# Patient Record
Sex: Male | Born: 1965
Health system: Southern US, Community
[De-identification: ages and names within clinical notes are randomized; demographics above are authoritative.]

## PROBLEM LIST (undated history)

## (undated) DIAGNOSIS — K219 Gastro-esophageal reflux disease without esophagitis: Secondary | ICD-10-CM

---

## 2015-02-05 ENCOUNTER — Emergency Department (HOSPITAL_COMMUNITY)
Admission: EM | Admit: 2015-02-05 | Discharge: 2015-02-06 | Disposition: A | Discharge status: Home / Self Care | Payer: BLUE CROSS/BLUE SHIELD | Attending: Emergency Medicine | Admitting: Emergency Medicine

## 2015-02-05 ENCOUNTER — Encounter (HOSPITAL_COMMUNITY): Payer: Self-pay | Admitting: Emergency Medicine

## 2015-02-05 DIAGNOSIS — N3090 Cystitis, unspecified without hematuria: Secondary | ICD-10-CM | POA: Diagnosis not present

## 2015-02-05 DIAGNOSIS — Z8719 Personal history of other diseases of the digestive system: Secondary | ICD-10-CM | POA: Diagnosis not present

## 2015-02-05 DIAGNOSIS — R0782 Intercostal pain: Secondary | ICD-10-CM | POA: Diagnosis not present

## 2015-02-05 DIAGNOSIS — R1032 Left lower quadrant pain: Secondary | ICD-10-CM

## 2015-02-05 DIAGNOSIS — R1030 Lower abdominal pain, unspecified: Secondary | ICD-10-CM | POA: Diagnosis present

## 2015-02-05 HISTORY — DX: Gastro-esophageal reflux disease without esophagitis: K21.9

## 2015-02-05 LAB — URINALYSIS, ROUTINE W REFLEX MICROSCOPIC
BILIRUBIN URINE: NEGATIVE
Glucose, UA: NEGATIVE mg/dL
Hgb urine dipstick: NEGATIVE
Ketones, ur: NEGATIVE mg/dL
Leukocytes, UA: NEGATIVE
NITRITE: NEGATIVE
Protein, ur: NEGATIVE mg/dL
SPECIFIC GRAVITY, URINE: 1.004 — AB (ref 1.005–1.030)
UROBILINOGEN UA: 0.2 mg/dL (ref 0.0–1.0)
pH: 6.5 (ref 5.0–8.0)

## 2015-02-05 LAB — LIPASE, BLOOD: Lipase: 43 U/L (ref 22–51)

## 2015-02-05 LAB — COMPREHENSIVE METABOLIC PANEL
ALT: 14 U/L — ABNORMAL LOW (ref 17–63)
ANION GAP: 8 (ref 5–15)
AST: 25 U/L (ref 15–41)
Albumin: 4.4 g/dL (ref 3.5–5.0)
Alkaline Phosphatase: 44 U/L (ref 38–126)
BILIRUBIN TOTAL: 0.8 mg/dL (ref 0.3–1.2)
BUN: 11 mg/dL (ref 6–20)
CO2: 29 mmol/L (ref 22–32)
Calcium: 9.5 mg/dL (ref 8.9–10.3)
Chloride: 102 mmol/L (ref 101–111)
Creatinine, Ser: 1.01 mg/dL (ref 0.61–1.24)
Glucose, Bld: 82 mg/dL (ref 65–99)
POTASSIUM: 4.1 mmol/L (ref 3.5–5.1)
Sodium: 139 mmol/L (ref 135–145)
TOTAL PROTEIN: 7 g/dL (ref 6.5–8.1)

## 2015-02-05 LAB — CBC
HEMATOCRIT: 45 % (ref 39.0–52.0)
HEMOGLOBIN: 15.5 g/dL (ref 13.0–17.0)
MCH: 31.6 pg (ref 26.0–34.0)
MCHC: 34.4 g/dL (ref 30.0–36.0)
MCV: 91.6 fL (ref 78.0–100.0)
Platelets: 200 10*3/uL (ref 150–400)
RBC: 4.91 MIL/uL (ref 4.22–5.81)
RDW: 12.5 % (ref 11.5–15.5)
WBC: 3.5 10*3/uL — AB (ref 4.0–10.5)

## 2015-02-05 MED ORDER — SODIUM CHLORIDE 0.9 % IV BOLUS (SEPSIS)
500.0000 mL | Freq: Once | INTRAVENOUS | Status: AC
  Administered 2015-02-06: 500 mL via INTRAVENOUS

## 2015-02-05 MED ORDER — KETOROLAC TROMETHAMINE 30 MG/ML IJ SOLN
15.0000 mg | Freq: Once | INTRAMUSCULAR | Status: AC
  Administered 2015-02-06: 15 mg via INTRAVENOUS
  Filled 2015-02-05: qty 1

## 2015-02-05 NOTE — ED Provider Notes (Signed)
CSN: [id number]     Arrival date & time 02/05/15  2027 History  This chart was scribed for Peter Munch, MD by Octavia Heir, ED Scribe. This patient was seen in room A07C/A07C and the patient's care was started at 11:39 PM.     Chief Complaint  Patient presents with  . Abdominal Pain      The history is provided by the patient. No language interpreter was used.   HPI Comments: Kinser Fellman is a 49 y.o. male who presents to the Emergency Department complaining of sudden onset, gradual worsening, intermittent, lower abdominal pain onset 2 weeks ago. Pt rates his current pain a 4/10. He states having intermittent mid lower abdominal pain that he describes is sharp. He reports having a burning sensation in his LUQ that has been constant for the past two days but was typically intermittent previously before. He notes having a colonoscopy done recently and the results were normal. Pt denies nausea, vomiting, fever, chest pain, loss of appetite, abdominal surgeries, urinary changes, difficulty breathing, chest pain, rash, skin changes, confusion, recent travel and scrotal pain.   Past Medical History  Diagnosis Date  . Acid reflux    History reviewed. No pertinent past surgical history. No family history on file. Social History  Substance Use Topics  . Smoking status: Never Smoker   . Smokeless tobacco: None  . Alcohol Use: Yes    Review of Systems  Constitutional: Negative for fever.       Per HPI, otherwise negative  HENT:       Per HPI, otherwise negative  Respiratory:       Per HPI, otherwise negative  Cardiovascular:       Per HPI, otherwise negative  Gastrointestinal: Positive for abdominal pain. Negative for nausea and vomiting.  Endocrine:       Negative aside from HPI  Genitourinary:       Neg aside from HPI   Musculoskeletal:       Per HPI, otherwise negative  Skin: Negative.  Negative for rash.  Neurological: Negative for syncope.  Psychiatric/Behavioral: Negative  for confusion.      Allergies  Review of patient's allergies indicates no known allergies.  Home Medications   Prior to Admission medications   Not on File   Triage vitals: BP 121/80 mmHg  Pulse 72  Temp(Src) 98.3 F (36.8 C) (Oral)  Resp 18  Ht  (1.803 m)  Wt 164 lb (74.39 kg)  BMI 22.88 kg/m2  SpO2 100% Physical Exam  Constitutional: He is oriented to person, place, and time. He appears well-developed. No distress.  HENT:  Head: Normocephalic and atraumatic.  Eyes: Conjunctivae and EOM are normal.  Cardiovascular: Normal rate and regular rhythm.   Pulmonary/Chest: Effort normal. No stridor. No respiratory distress.  Abdominal: He exhibits no distension.  Left intercostal pain with no splenomegaly   Musculoskeletal: He exhibits no edema.  Neurological: He is alert and oriented to person, place, and time.  Skin: Skin is warm and dry.  Psychiatric: He has a normal mood and affect.  Nursing note and vitals reviewed.   ED Course  Procedures  DIAGNOSTIC STUDIES: Oxygen Saturation is 100% on RA, normal by my interpretation.  COORDINATION OF CARE:  11:45 PM Discussed treatment plan with pt at bedside and pt agreed to plan.  Labs Review Labs Reviewed  COMPREHENSIVE METABOLIC PANEL - Abnormal; Notable for the following:    ALT 14 (*)    All other components within normal limits  CBC - Abnormal; Notable for the following:    WBC 3.5 (*)    All other components within normal limits  URINALYSIS, ROUTINE W REFLEX MICROSCOPIC (NOT AT [facility]) - Abnormal; Notable for the following:    Specific Gravity, Urine 1.004 (*)    All other components within normal limits  LIPASE, BLOOD    Imaging Review Ct Abdomen Pelvis W Contrast  02/06/2015   CLINICAL DATA:  Mid lower abdominal pain for 2 weeks. Left lower quadrant burning for 2 days. Left lower quadrant and left inguinal pain is not constant.  EXAM: CT ABDOMEN AND PELVIS WITH CONTRAST  TECHNIQUE: Multidetector CT imaging  of the abdomen and pelvis was performed using the standard protocol following bolus administration of intravenous contrast.  CONTRAST:  OMNIPAQUE IOHEXOL 300 MG/ML  SOLN  COMPARISON:  None.  FINDINGS: Lung bases are clear.  Few scattered sub cm low-attenuation lesions in the liver. These are too small to characterize but likely represent small cysts or hemangiomas. No other focal liver lesions. Gallbladder, spleen, pancreas, adrenal glands, kidneys, abdominal aorta, inferior vena cava, and retroperitoneal lymph nodes are unremarkable. Stomach and small bowel are decompressed. Stool-filled colon without abnormal distention. No free air or free fluid in the abdomen.  Pelvis: Appendix is normal. No free or loculated pelvic fluid collections. No evidence of diverticulitis. Small amount of free fluid in the pelvis is nonspecific but could be reactive. The bladder wall is diffusely thickened which may be due to cystitis or hypertrophy due to outlet obstruction. Prostate gland is enlarged, measuring 4.6 cm transverse diameter. No destructive bone lesions.  IMPRESSION: Prostate gland is enlarged. Bladder wall is thickened. This could be due to outlet obstruction with hypertrophy versus cystitis. Small amount of free fluid in the pelvis is nonspecific but may be reactive. Appendix is normal. No evidence of diverticulitis. Sub cm lesions in the liver, likely cysts.   Electronically Signed   By: Burman Nieves M.D.   On: 02/06/2015 01:24   I have personally reviewed and evaluated these images and lab results as part of my medical decision-making. Exam the patient is in no distress. We discussed all findings at length. Patient will follow-up with urology.   MDM   I personally performed the services described in this documentation, which was scribed in my presence. The recorded information has been reviewed and is accurate.   Well-appearing male presents with new left lower abdominal, left lateral abdominal  pain, nonspecific urinary changes. Here the patient is awake and alert, non-peritoneal abdomen, no fever, low suspicion for occult sepsis, bacteremia. No evidence for UTI. However, the patient's CT scan suggests cystitis, prostatic enlargement. With stable vital signs, the patient was started on an inflammatory medication will follow-up with urology.   Peter Munch, MD 02/06/15 [phone]

## 2015-02-05 NOTE — ED Notes (Signed)
Mid lower abd pain x 2 weeks.  LLQ burning pain x 2 days.  Denies nausea, vomiting, urinary complaints, diarrhea, and constipation.  Last BM 1 hour ago.

## 2015-02-05 NOTE — ED Notes (Signed)
Patient denies difficulty with urination, denies trouble with bowels.

## 2015-02-06 ENCOUNTER — Emergency Department (HOSPITAL_COMMUNITY): Payer: BLUE CROSS/BLUE SHIELD

## 2015-02-06 ENCOUNTER — Encounter (HOSPITAL_COMMUNITY): Payer: Self-pay | Admitting: Radiology

## 2015-02-06 MED ORDER — IOHEXOL 300 MG/ML  SOLN: 25.0000 mL | Freq: Once | INTRAMUSCULAR | Status: DC | PRN

## 2015-02-06 MED ORDER — IOHEXOL 300 MG/ML  SOLN
100.0000 mL | Freq: Once | INTRAMUSCULAR | Status: AC | PRN
  Administered 2015-02-06: 100 mL via INTRAVENOUS

## 2015-02-06 MED ORDER — MELOXICAM 7.5 MG PO TABS: 7.5000 mg | ORAL_TABLET | Freq: Every day | ORAL | Status: DC

## 2015-02-06 NOTE — Discharge Instructions (Signed)
As discussed, today's evaluation is resulted in a diagnosis of cystitis, or bladder wall inflammation.  It is important that you follow-up with our urologists for further evaluation and management.  Return here for concerning changes in your condition.

## 2015-03-29 ENCOUNTER — Emergency Department (HOSPITAL_COMMUNITY): Payer: BLUE CROSS/BLUE SHIELD

## 2015-03-29 ENCOUNTER — Emergency Department (HOSPITAL_COMMUNITY)
Admission: EM | Admit: 2015-03-29 | Discharge: 2015-03-30 | Disposition: A | Discharge status: Home / Self Care | Payer: BLUE CROSS/BLUE SHIELD | Attending: Emergency Medicine | Admitting: Emergency Medicine

## 2015-03-29 ENCOUNTER — Encounter (HOSPITAL_COMMUNITY): Payer: Self-pay | Admitting: *Deleted

## 2015-03-29 DIAGNOSIS — Z8719 Personal history of other diseases of the digestive system: Secondary | ICD-10-CM | POA: Diagnosis not present

## 2015-03-29 DIAGNOSIS — R51 Headache: Secondary | ICD-10-CM | POA: Insufficient documentation

## 2015-03-29 DIAGNOSIS — G44209 Tension-type headache, unspecified, not intractable: Secondary | ICD-10-CM

## 2015-03-29 DIAGNOSIS — R509 Fever, unspecified: Secondary | ICD-10-CM | POA: Insufficient documentation

## 2015-03-29 MED ORDER — DIPHENHYDRAMINE HCL 50 MG/ML IJ SOLN
25.0000 mg | Freq: Once | INTRAMUSCULAR | Status: AC
  Administered 2015-03-29: 25 mg via INTRAVENOUS
  Filled 2015-03-29: qty 1

## 2015-03-29 MED ORDER — SODIUM CHLORIDE 0.9 % IV SOLN
1000.0000 mL | Freq: Once | INTRAVENOUS | Status: AC
  Administered 2015-03-29: 1000 mL via INTRAVENOUS

## 2015-03-29 MED ORDER — METOCLOPRAMIDE HCL 5 MG/ML IJ SOLN
10.0000 mg | Freq: Once | INTRAMUSCULAR | Status: AC
  Administered 2015-03-29: 10 mg via INTRAVENOUS
  Filled 2015-03-29: qty 2

## 2015-03-29 MED ORDER — SODIUM CHLORIDE 0.9 % IV SOLN
1000.0000 mL | INTRAVENOUS | Status: DC
Start: 2015-03-29 — End: 2015-03-30
  Administered 2015-03-29: 1000 mL via INTRAVENOUS

## 2015-03-29 NOTE — ED Provider Notes (Addendum)
CSN: [id number]   Arrival date & time 03/29/15 8657  History  By signing my name below, I, Bethel Born, attest that this documentation has been prepared under the direction and in the presence of Dione Booze, MD. Electronically Signed: Bethel Born, ED Scribe. 03/29/2015. 11:32 PM.  Chief Complaint  Patient presents with  . Headache    HPI The history is provided by the patient. No language interpreter was used.   Peter Chapman is a 49 y.o. male who presents to the Emergency Department complaining of a new and intermittent bilateral headache with onset 2.5 weeks ago. He describes the pain as sharp/pressure and rates it 5/10 in severity with no modifying factors.The pain is worse in the evening. Ibuprofen provided insufficient relief at home. Associated symptoms include tactile fever/temperature of 98 degrees (pt states that his temperature usually runs around 97 degrees) at home for 2 days. Pt denies vision change, weakness, numbness, tingling, nausea, vomiting, gait difficulty, change in strength or coordination, and speech difficulty. His primary care is [facility] where he was seen for his symptoms and referred to the ED.   Past Medical History  Diagnosis Date  . Acid reflux     History reviewed. No pertinent past surgical history.  History reviewed. No pertinent family history.  Social History  Substance Use Topics  . Smoking status: Never Smoker   . Smokeless tobacco: None  . Alcohol Use: Yes     Review of Systems  Constitutional: Positive for fever (Tactile).  Neurological: Positive for headaches.  All other systems reviewed and are negative.  Home Medications   Prior to Admission medications   Medication Sig Start Date End Date Taking? Authorizing Provider  Multiple Vitamin (MULTIVITAMIN WITH MINERALS) TABS tablet Take 1 tablet by mouth daily.   Yes Historical Provider, MD    Allergies  Review of patient's allergies indicates no known  allergies.  Triage Vitals: BP 129/71 mmHg  Pulse 77  Temp(Src) 98.3 F (36.8 C) (Oral)  Resp 22  SpO2 100%  Physical Exam  Constitutional: He is oriented to person, place, and time. He appears well-developed and well-nourished.  HENT:  Head: Normocephalic and atraumatic.  Mild tenderness over right temporalis muscle and insertion of left paracervical muscles  Eyes: EOM are normal. Pupils are equal, round, and reactive to light.  Fundi are normal  Neck: Normal range of motion. No JVD present.  Cardiovascular: Normal rate, regular rhythm, normal heart sounds and intact distal pulses.   No murmur heard. Pulmonary/Chest: Effort normal and breath sounds normal. He has no wheezes. He has no rales. He exhibits no tenderness.  Abdominal: Soft. Bowel sounds are normal. He exhibits no distension and no mass. There is no tenderness.  Musculoskeletal: Normal range of motion.  Lymphadenopathy:    He has no cervical adenopathy.  Neurological: He is alert and oriented to person, place, and time. He has normal reflexes. No cranial nerve deficit. He exhibits normal muscle tone. Coordination normal.  Muscle strength 5/5 in both arms and both legs. Normal coordination. No sensory deficits.  Skin: Skin is warm and dry. No rash noted.  Psychiatric: He has a normal mood and affect. His behavior is normal. Judgment and thought content normal.  Nursing note and vitals reviewed.   ED Course  Procedures   DIAGNOSTIC STUDIES: Oxygen Saturation is 100% on RA, normal by my interpretation.    COORDINATION OF CARE: 11:25 PM Discussed treatment plan which includes lab work, CT head, and pain management with pt  at bedside and pt agreed to plan.  Results for orders placed or performed during the hospital encounter of 03/29/15  Basic metabolic panel  Result Value Ref Range   Sodium 140 135 - 145 mmol/L   Potassium 3.9 3.5 - 5.1 mmol/L   Chloride 107 101 - 111 mmol/L   CO2 24 22 - 32 mmol/L   Glucose,  Bld 100 (H) 65 - 99 mg/dL   BUN 13 6 - 20 mg/dL   Creatinine, Ser 0.98 0.61 - 1.24 mg/dL   Calcium 9.0 8.9 - 11.9 mg/dL   GFR calc non Af Amer >60 >60 mL/min   GFR calc Af Amer >60 >60 mL/min   Anion gap 9 5 - 15  CBC with Differential  Result Value Ref Range   WBC 5.0 4.0 - 10.5 K/uL   RBC 4.44 4.22 - 5.81 MIL/uL   Hemoglobin 14.1 13.0 - 17.0 g/dL   HCT 14.7 82.9 - 56.2 %   MCV 91.0 78.0 - 100.0 fL   MCH 31.8 26.0 - 34.0 pg   MCHC 34.9 30.0 - 36.0 g/dL   RDW 13.0 86.5 - 78.4 %   Platelets 178 150 - 400 K/uL   Neutrophils Relative % 78 %   Neutro Abs 3.8 1.7 - 7.7 K/uL   Lymphocytes Relative 13 %   Lymphs Abs 0.7 0.7 - 4.0 K/uL   Monocytes Relative 8 %   Monocytes Absolute 0.4 0.1 - 1.0 K/uL   Eosinophils Relative 1 %   Eosinophils Absolute 0.0 0.0 - 0.7 K/uL   Basophils Relative 0 %   Basophils Absolute 0.0 0.0 - 0.1 K/uL   Imaging Review Ct Head Wo Contrast  03/30/2015  CLINICAL DATA:  Initial evaluation for headache for 2-1/2 weeks. EXAM: CT HEAD WITHOUT CONTRAST TECHNIQUE: Contiguous axial images were obtained from the base of the skull through the vertex without intravenous contrast. COMPARISON:  None. FINDINGS: There is no acute intracranial hemorrhage or infarct. No mass lesion or midline shift. Gray-white matter differentiation is well maintained. Ventricles are normal in size without evidence of hydrocephalus. CSF containing spaces are within normal limits. No extra-axial fluid collection. The calvarium is intact. Orbital soft tissues are within normal limits. The paranasal sinuses and mastoid air cells are well pneumatized and free of fluid. Scalp soft tissues are unremarkable. IMPRESSION: Normal head CT with no acute intracranial process identified. Electronically Signed   By: Rise Mu M.D.   On: 03/30/2015 00:06    I personally reviewed and evaluated these images and lab results as a part of my medical decision-making.    MDM   Final diagnoses:  Muscle  contraction headache    Headache in patient without significant history of headaches. Pattern of headaches is actually suggestive of muscle contraction headache with symptoms thinning to be very mild early in the day and getting worse as the day goes on and no interference with sleep. Since he has not been prone to headaches, decision was made to get CT scan to screen for serious pathology. He was also given a headache cocktail of normal saline, metoclopramide, and diphenhydramine. CT has come back normal and he had excellent relief of headache from the headache cocktail. He was given reassurance of benign nature of his headache and is discharged with prescription for metoclopramide.  I personally performed the services described in this documentation, which was scribed in my presence. The recorded information has been reviewed and is accurate.      Dione Booze, MD  03/30/15 [id number]  Dione Boozeavid Denham Mose, MD 03/30/15 [phone]

## 2015-03-29 NOTE — ED Notes (Signed)
Pt in c/o headaches for the last few weeks, states the severity comes and goes but is always constant, states this is abnormal for him, reports fever the last few days, fever of 98 at home which patient is calling a fever, also states his head is hot to touch, no distress noted

## 2015-03-30 LAB — CBC WITH DIFFERENTIAL/PLATELET
BASOS ABS: 0 10*3/uL (ref 0.0–0.1)
Basophils Relative: 0 %
EOS ABS: 0 10*3/uL (ref 0.0–0.7)
EOS PCT: 1 %
HCT: 40.4 % (ref 39.0–52.0)
Hemoglobin: 14.1 g/dL (ref 13.0–17.0)
LYMPHS PCT: 13 %
Lymphs Abs: 0.7 10*3/uL (ref 0.7–4.0)
MCH: 31.8 pg (ref 26.0–34.0)
MCHC: 34.9 g/dL (ref 30.0–36.0)
MCV: 91 fL (ref 78.0–100.0)
MONO ABS: 0.4 10*3/uL (ref 0.1–1.0)
Monocytes Relative: 8 %
Neutro Abs: 3.8 10*3/uL (ref 1.7–7.7)
Neutrophils Relative %: 78 %
PLATELETS: 178 10*3/uL (ref 150–400)
RBC: 4.44 MIL/uL (ref 4.22–5.81)
RDW: 12.5 % (ref 11.5–15.5)
WBC: 5 10*3/uL (ref 4.0–10.5)

## 2015-03-30 LAB — BASIC METABOLIC PANEL
Anion gap: 9 (ref 5–15)
BUN: 13 mg/dL (ref 6–20)
CALCIUM: 9 mg/dL (ref 8.9–10.3)
CHLORIDE: 107 mmol/L (ref 101–111)
CO2: 24 mmol/L (ref 22–32)
CREATININE: 0.84 mg/dL (ref 0.61–1.24)
Glucose, Bld: 100 mg/dL — ABNORMAL HIGH (ref 65–99)
POTASSIUM: 3.9 mmol/L (ref 3.5–5.1)
SODIUM: 140 mmol/L (ref 135–145)

## 2015-03-30 MED ORDER — METOCLOPRAMIDE HCL 10 MG PO TABS: 10.0000 mg | ORAL_TABLET | Freq: Four times a day (QID) | ORAL | Status: DC | PRN

## 2015-03-30 NOTE — ED Notes (Signed)
Pt left with all belongings and ambulated out of treatment area.  

## 2015-03-30 NOTE — Discharge Instructions (Signed)
Tension Headache °A tension headache is a feeling of pain, pressure, or aching that is often felt over the front and sides of the head. The pain can be dull, or it can feel tight (constricting). Tension headaches are not normally associated with nausea or vomiting, and they do not get worse with physical activity. Tension headaches can last from 30 minutes to several days. This is the most common type of headache. °CAUSES °The exact cause of this condition is not known. Tension headaches often begin after stress, anxiety, or depression. Other triggers may include: °· Alcohol. °· Too much caffeine, or caffeine withdrawal. °· Respiratory infections, such as colds, flu, or sinus infections. °· Dental problems or teeth clenching. °· Fatigue. °· Holding your head and neck in the same position for a long period of time, such as while using a computer. °· Smoking. °SYMPTOMS °Symptoms of this condition include: °· A feeling of pressure around the head. °· Dull, aching head pain. °· Pain felt over the front and sides of the head. °· Tenderness in the muscles of the head, neck, and shoulders. °DIAGNOSIS °This condition may be diagnosed based on your symptoms and a physical exam. Tests may be done, such as a CT scan or an MRI of your head. These tests may be done if your symptoms are severe or unusual. °TREATMENT °This condition may be treated with lifestyle changes and medicines to help relieve symptoms. °HOME CARE INSTRUCTIONS °Managing Pain °· Take over-the-counter and prescription medicines only as told by your health care provider. °· Lie down in a dark, quiet room when you have a headache. °· If directed, apply ice to the head and neck area: °¨ Put ice in a plastic bag. °¨ Place a towel between your skin and the bag. °¨ Leave the ice on for 20 minutes, 2-3 times per day. °· Use a heating pad or a hot shower to apply heat to the head and neck area as told by your health care provider. °Eating and Drinking °· Eat meals on  a regular schedule. °· Limit alcohol use. °· Decrease your caffeine intake, or stop using caffeine. °General Instructions °· Keep all follow-up visits as told by your health care provider. This is important. °· Keep a headache journal to help find out what may trigger your headaches. For example, write down: °¨ What you eat and drink. °¨ How much sleep you get. °¨ Any change to your diet or medicines. °· Try massage or other relaxation techniques. °· Limit stress. °· Sit up straight, and avoid tensing your muscles. °· Do not use tobacco products, including cigarettes, chewing tobacco, or e-cigarettes. If you need help quitting, ask your health care provider. °· Exercise regularly as told by your health care provider. °· Get 7-9 hours of sleep, or the amount recommended by your health care provider. °SEEK MEDICAL CARE IF: °· Your symptoms are not helped by medicine. °· You have a headache that is different from what you normally experience. °· You have nausea or you vomit. °· You have a fever. °SEEK IMMEDIATE MEDICAL CARE IF: °· Your headache becomes severe. °· You have repeated vomiting. °· You have a stiff neck. °· You have a loss of vision. °· You have problems with speech. °· You have pain in your eye or ear. °· You have muscular weakness or loss of muscle control. °· You lose your balance or you have trouble walking. °· You feel faint or you pass out. °· You have confusion. °  °  This information is not intended to replace advice given to you by your health care provider. Make sure you discuss any questions you have with your health care provider. °  °Document Released: 05/01/2005 Document Revised: 01/20/2015 Document Reviewed: 08/24/2014 °Elsevier Interactive Patient Education ©2016 [employer] ° °Metoclopramide tablets °What is this medicine? °METOCLOPRAMIDE (met oh kloe PRA mide) is used to treat the symptoms of gastroesophageal reflux disease (GERD) like heartburn. It is also used to treat people with slow  emptying of the stomach and intestinal tract. °This medicine may be used for other purposes; ask your health care provider or pharmacist if you have questions. °What should I tell my health care provider before I take this medicine? °They need to know if you have any of these conditions: °-breast cancer °-depression °-diabetes °-heart failure °-high blood pressure °-kidney disease °-liver disease °-Parkinson's disease or a movement disorder °-pheochromocytoma °-seizures °-stomach obstruction, bleeding, or perforation °-an unusual or allergic reaction to metoclopramide, procainamide, sulfites, other medicines, foods, dyes, or preservatives °-pregnant or trying to get pregnant °-breast-feeding °How should I use this medicine? °Take this medicine by mouth with a glass of water. Follow the directions on the prescription label. Take this medicine on an empty stomach, about 30 minutes before eating. Take your doses at regular intervals. Do not take your medicine more often than directed. Do not stop taking except on the advice of your doctor or health care professional. °A special MedGuide will be given to you by the pharmacist with each prescription and refill. Be sure to read this information carefully each time. °Talk to your pediatrician regarding the use of this medicine in children. Special care may be needed. °Overdosage: If you think you have taken too much of this medicine contact a poison control center or emergency room at once. °NOTE: This medicine is only for you. Do not share this medicine with others. °What if I miss a dose? °If you miss a dose, take it as soon as you can. If it is almost time for your next dose, take only that dose. Do not take double or extra doses. °What may interact with this medicine? °-acetaminophen °-cyclosporine °-digoxin °-medicines for blood pressure °-medicines for diabetes, including insulin °-medicines for hay fever and other allergies °-medicines for depression, especially an  Monoamine Oxidase Inhibitor (MAOI) °-medicines for Parkinson's disease, like levodopa °-medicines for sleep or for pain °-tetracycline °This list may not describe all possible interactions. Give your health care provider a list of all the medicines, herbs, non-prescription drugs, or dietary supplements you use. Also tell them if you smoke, drink alcohol, or use illegal drugs. Some items may interact with your medicine. °What should I watch for while using this medicine? °It may take a few weeks for your stomach condition to start to get better. However, do not take this medicine for longer than 12 weeks. The longer you take this medicine, and the more you take it, the greater your chances are of developing serious side effects. If you are an elderly patient, a male patient, or you have diabetes, you may be at an increased risk for side effects from this medicine. Contact your doctor immediately if you start having movements you cannot control such as lip smacking, rapid movements of the tongue, involuntary or uncontrollable movements of the eyes, head, arms and legs, or muscle twitches and spasms. °Patients and their families should watch out for worsening depression or thoughts of suicide. Also watch out for any sudden or severe changes in   feelings such as feeling anxious, agitated, panicky, irritable, hostile, aggressive, impulsive, severely restless, overly excited and hyperactive, or not being able to sleep. If this happens, especially at the beginning of treatment or after a change in dose, call your doctor. Do not treat yourself for high fever. Ask your doctor or health care professional for advice. You may get drowsy or dizzy. Do not drive, use machinery, or do anything that needs mental alertness until you know how this drug affects you. Do not stand or sit up quickly, especially if you are an older patient. This reduces the risk of dizzy or fainting spells. Alcohol can make you more drowsy and dizzy.  Avoid alcoholic drinks. What side effects may I notice from receiving this medicine? Side effects that you should report to your doctor or health care professional as soon as possible: -allergic reactions like skin rash, itching or hives, swelling of the face, lips, or tongue -abnormal production of milk in females -breast enlargement in both males and females -change in the way you walk -difficulty moving, speaking or swallowing -drooling, lip smacking, or rapid movements of the tongue -excessive sweating -fever -involuntary or uncontrollable movements of the eyes, head, arms and legs -irregular heartbeat or palpitations -muscle twitches and spasms -unusually weak or tired Side effects that usually do not require medical attention (report to your doctor or health care professional if they continue or are bothersome): -change in sex drive or performance -depressed mood -diarrhea -difficulty sleeping -headache -menstrual changes -restless or nervous This list may not describe all possible side effects. Call your doctor for medical advice about side effects. You may report side effects to FDA at 1-800-FDA-1088. Where should I keep my medicine? Keep out of the reach of children. Store at room temperature between 20 and 25 degrees C (68 and 77 degrees F). Protect from light. Keep container tightly closed. Throw away any unused medicine after the expiration date. NOTE: This sheet is a summary. It may not cover all possible information. If you have questions about this medicine, talk to your doctor, pharmacist, or health care provider.    2016, Elsevier/Gold Standard. (2011-08-29 13:04:38)

## 2016-08-27 IMAGING — CT CT ABD-PELV W/ CM
2 of 5 series · 16 of 46 positions shown, 18 images · IV contrast (APPLIED)
Comparison: None.

CLINICAL DATA: Mid lower abdominal pain for 2 weeks. Left lower
quadrant burning for 2 days. Left lower quadrant and left inguinal
pain is not constant.

EXAM:
CT ABDOMEN AND PELVIS WITH CONTRAST
TECHNIQUE: Multidetector CT imaging of the abdomen and pelvis was performed
using the standard protocol following bolus administration of
intravenous contrast.
CONTRAST:  100mL OMNIPAQUE IOHEXOL 300 MG/ML  SOLN

[Series 2: abd/ pelvis 5.0 i30f 1 · axial · 0.78mm/px · z∈[-678,-298]mm · 13 of 88 slices shown, 15 images]
[im 6/88  soft-tissue]
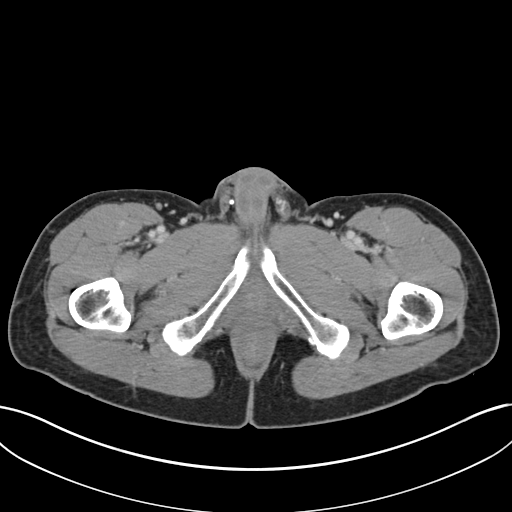
[im 6/88  bone]
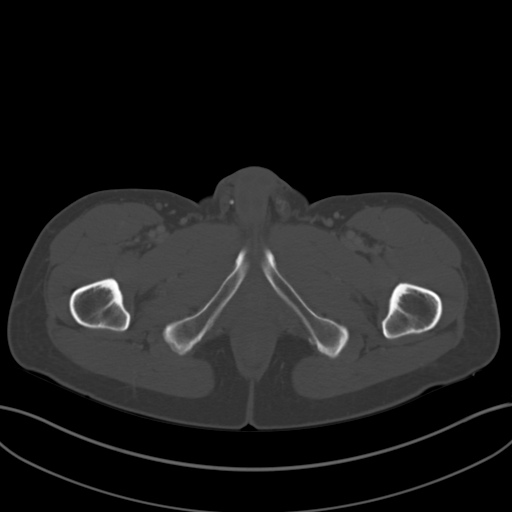
[im 11/88  soft-tissue]
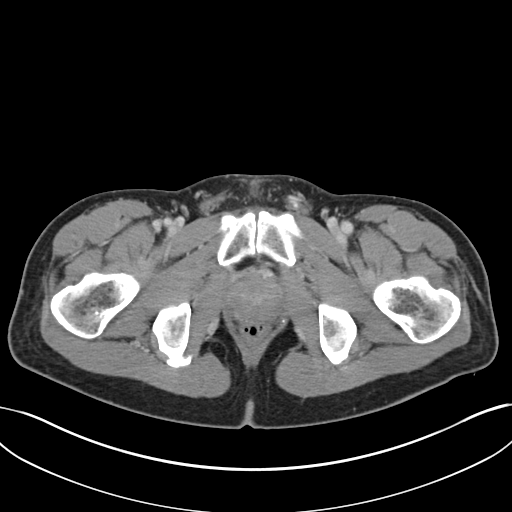
[im 21/88  soft-tissue]
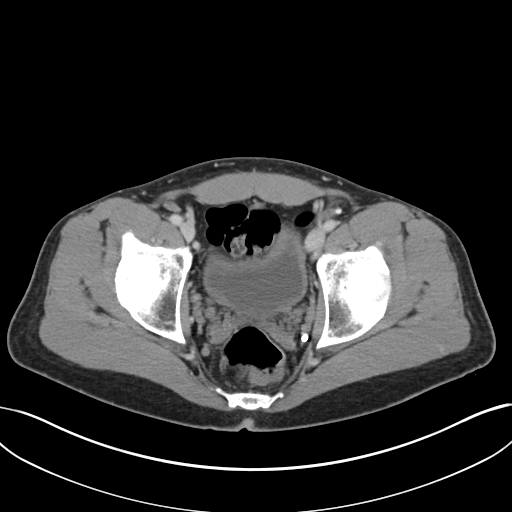
[im 26/88  soft-tissue]
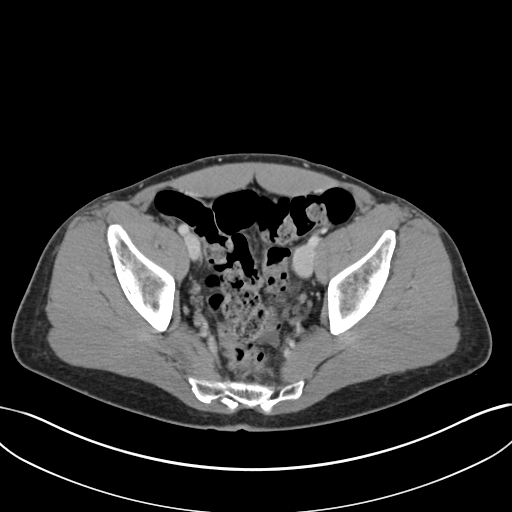
[im 31/88  soft-tissue]
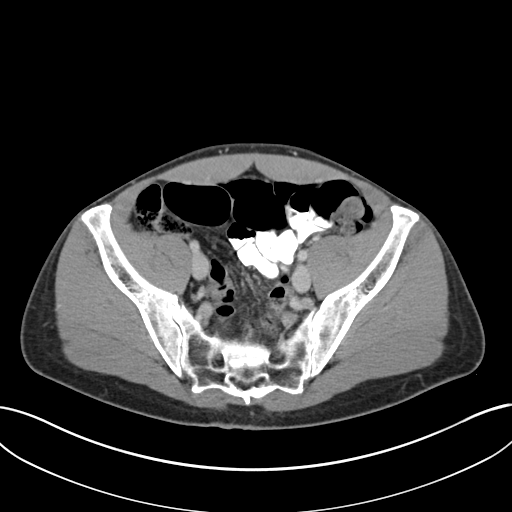
[im 36/88  soft-tissue]
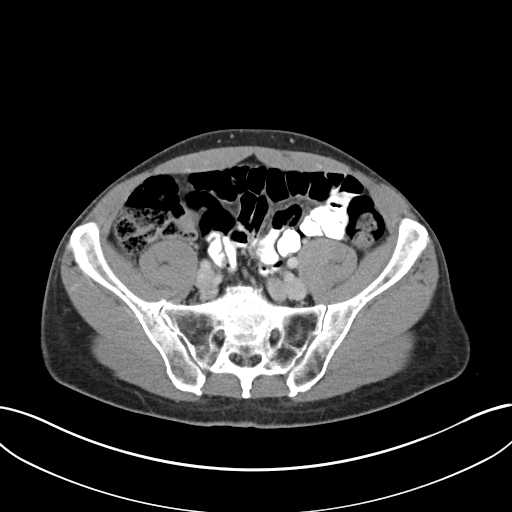
[im 47/88  soft-tissue]
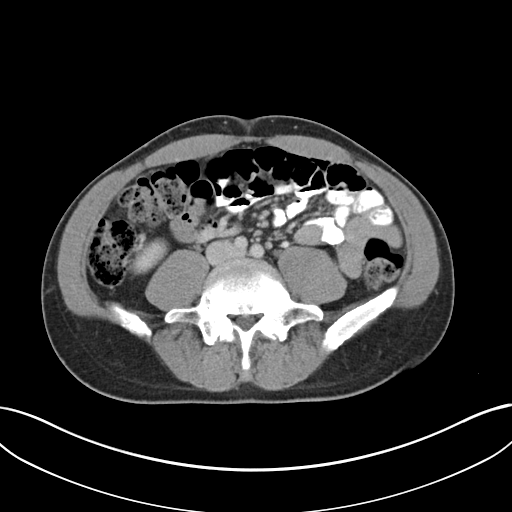
[im 52/88  soft-tissue]
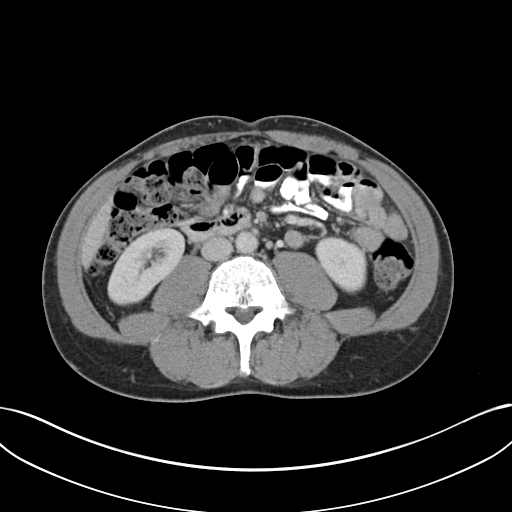
[im 57/88  soft-tissue]
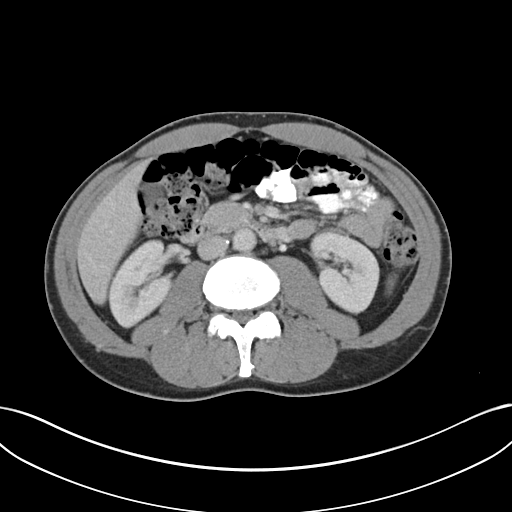
[im 57/88  bone]
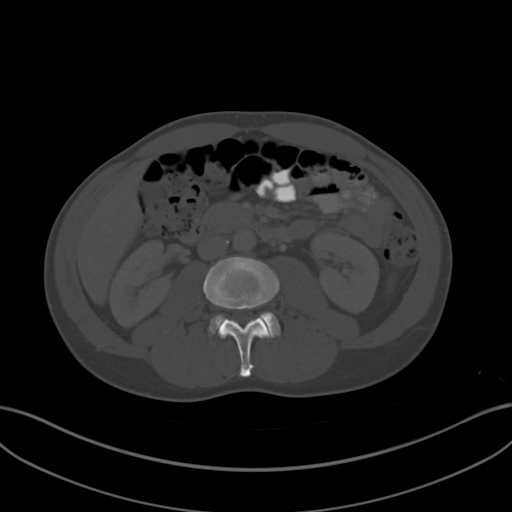
[im 62/88  soft-tissue]
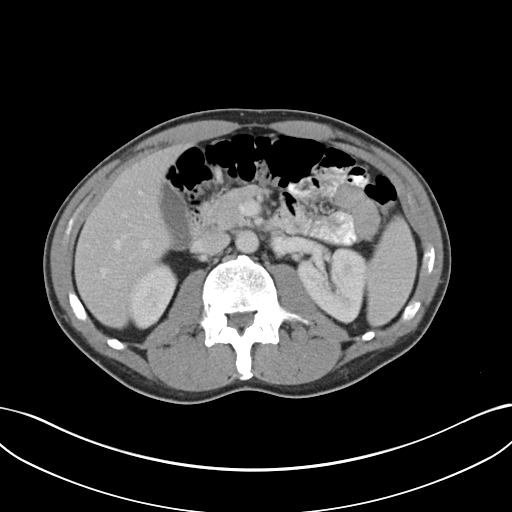
[im 67/88  soft-tissue]
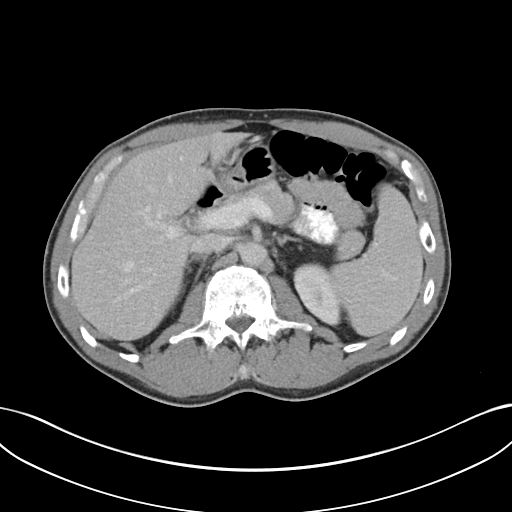
[im 77/88  soft-tissue]
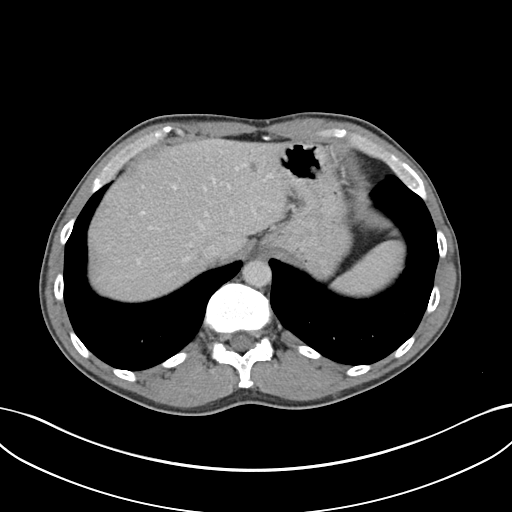
[im 82/88  soft-tissue]
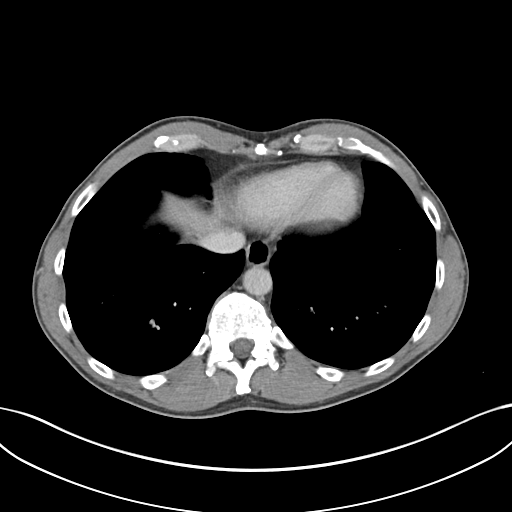

[Series 5: coronal soft tissue · coronal · 0.72mm/px · 3 of 89 slices shown]
[im 30/89  soft-tissue]
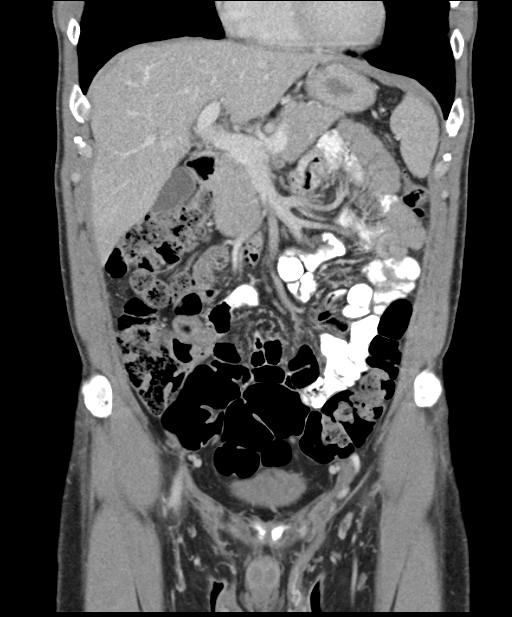
[im 40/89  soft-tissue]
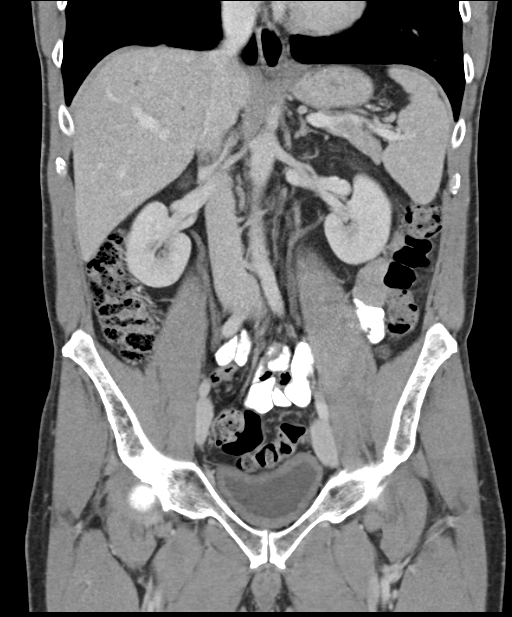
[im 49/89  soft-tissue]
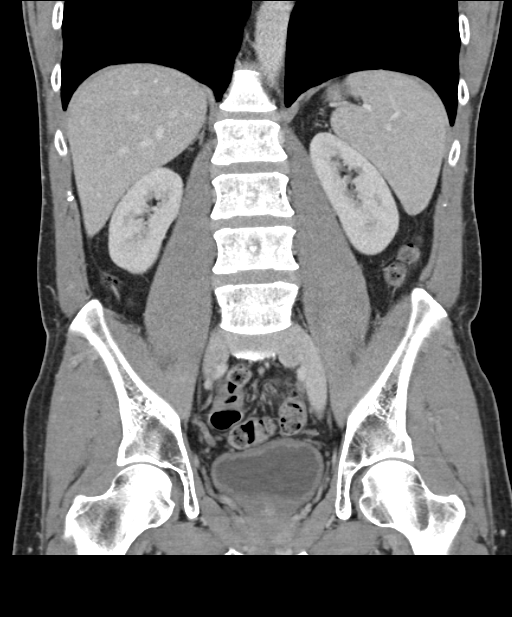

[16 of 46 positions shown; findings below may reference images not displayed]

FINDINGS: Lung bases are clear.

Few scattered sub cm low-attenuation lesions in the liver. These are
too small to characterize but likely represent small cysts or
hemangiomas. No other focal liver lesions. Gallbladder, spleen,
pancreas, adrenal glands, kidneys, abdominal aorta, inferior vena
cava, and retroperitoneal lymph nodes are unremarkable. Stomach and
small bowel are decompressed. Stool-filled colon without abnormal
distention. No free air or free fluid in the abdomen.

Pelvis: Appendix is normal. No free or loculated pelvic fluid
collections. No evidence of diverticulitis. Small amount of free
fluid in the pelvis is nonspecific but could be reactive. The
bladder wall is diffusely thickened which may be due to cystitis or
hypertrophy due to outlet obstruction. Prostate gland is enlarged,
measuring 4.6 cm transverse diameter. No destructive bone lesions.
IMPRESSION: Prostate gland is enlarged. Bladder wall is thickened. This could be
due to outlet obstruction with hypertrophy versus cystitis. Small
amount of free fluid in the pelvis is nonspecific but may be
reactive. Appendix is normal. No evidence of diverticulitis. Sub cm
lesions in the liver, likely cysts.

## 2021-02-01 DIAGNOSIS — G629 Polyneuropathy, unspecified: Secondary | ICD-10-CM | POA: Diagnosis not present

## 2021-02-20 NOTE — Progress Notes (Signed)
Subjective:    Patient ID: Peter Chapman, male    DOB: 1965-06-10, 55 y.o.   MRN: [id number]  HPI: Peter Chapman is a 55 y.o. male presenting for new patient visit to establish care.  Introduced to Publishing rights manager role and practice setting.  All questions answered.  Discussed provider/patient relationship and expectations.  Chief Complaint  Patient presents with   Establish Care    Requesting psa testing as well comprehensive testing  Problems with sleep works at night, on and off cloudy feeling    L groin sharp pain last week 2 days    [employer] Works on websites night shift; awake all night every night.  Has been having more issues with sleeping during the day.  Describes as a "cloudy feeling" in his head.  Comes and goes.  Never goes away completely.  Within last couple of months, this has been getting worse.  Thinks not sleeping well makes this worse.  Starting to have numbness on head and worse headaches. Duration: years Onset: gradual Severity: ranges from mild to severe Frequency: almost every day Location: numbness Headache duration: hours Radiation: no Time of day headache occurs: usually worse as the evening goes on Alleviating factors: sinus pill Aggravating factors: lack of sleep,  Headache status at time of visit: asymptomatic Treatments attempted: nothing tried   Aura: no Nausea:  no Vomiting: no Photophobia:  yes Phonophobia:  no Effect on social functioning:  no Numbers of missed days of school/work each month: none Confusion:  no Irritable: yes Gait disturbance/ataxia:  no Falls: no Behavioral changes:  no Fevers:  no Sleep schedule: goes to bed around 4:30-5 am and wakes up around 2 pm, sometimes only able to get about 6 hours of sleep. Water intake: 64 oz  Caffeine: drinks nespresso with breakfast  Has also noticed vision is slowly worsening, has appt with eye doctor with December.  GROIN PAIN Onset: sudden; weeks Location: left side Duration:  sudden and goes away quick Pain: twinging Severity: mild Characteristics: light twinging pain Timing: 3-4 times intermittently Aggravating Factors: nothing known Relieving Factors: nothing known Treatments attempted: nothing tried Meaning to patient: does not know  No Known Allergies  Outpatient Encounter Medications as of 02/21/2021  Medication Sig   Multiple Vitamin (MULTIVITAMIN WITH MINERALS) TABS tablet Take 1 tablet by mouth daily. (Patient not taking: Reported on 02/21/2021)   [DISCONTINUED] metoCLOPramide (REGLAN) 10 MG tablet Take 1 tablet (10 mg total) by mouth every 6 (six) hours as needed for nausea (or headache). (Patient not taking: Reported on 02/21/2021)   No facility-administered encounter medications on file as of 02/21/2021.    Active Ambulatory Problems    Diagnosis Date Noted   No Active Ambulatory Problems   Resolved Ambulatory Problems    Diagnosis Date Noted   No Resolved Ambulatory Problems   Past Medical History:  Diagnosis Date   Acid reflux     Past Medical History:  Diagnosis Date   Acid reflux     No past surgical history on file.  Social History   Tobacco Use   Smoking status: Never  Substance Use Topics   Alcohol use: Yes   Drug use: No    No family history on file.  Review of Systems Per HPI unless specifically indicated above     Objective:    BP 118/78   Pulse 77   Temp (!) 97.3 F (36.3 C)   Ht 5\' 11"  (1.803 m)   Wt 177 lb (80.3 kg)  SpO2 97%   BMI 24.69 kg/m   Wt Readings from Last 3 Encounters:  02/21/21 177 lb (80.3 kg)  02/05/15 164 lb (74.4 kg)    Physical Exam Vitals and nursing note reviewed. Exam conducted with a chaperone present (BJ).  Constitutional:      General: He is not in acute distress.    Appearance: Normal appearance. He is obese. He is not toxic-appearing.  HENT:     Head: Normocephalic and atraumatic.  Eyes:     General: No scleral icterus.       Right eye: No discharge.         Left eye: No discharge.     Extraocular Movements: Extraocular movements intact.     Conjunctiva/sclera: Conjunctivae normal.     Pupils: Pupils are equal, round, and reactive to light.  Neck:     Vascular: No carotid bruit.  Cardiovascular:     Rate and Rhythm: Normal rate.     Heart sounds: Normal heart sounds. No murmur heard. Pulmonary:     Effort: Pulmonary effort is normal. No respiratory distress.     Breath sounds: Normal breath sounds. No wheezing or rhonchi.  Genitourinary:      Comments: Pain to deep palpation; no discrete masses palpated Musculoskeletal:     Cervical back: Normal range of motion.     Right lower leg: No edema.     Left lower leg: No edema.  Skin:    General: Skin is warm and dry.     Coloration: Skin is not jaundiced or pale.     Findings: No erythema.  Neurological:     General: No focal deficit present.     Mental Status: He is alert and oriented to person, place, and time.     Cranial Nerves: No cranial nerve deficit.     Motor: No weakness.     Coordination: Coordination normal.     Gait: Gait normal.  Psychiatric:        Mood and Affect: Mood normal.        Behavior: Behavior normal.        Thought Content: Thought content normal.        Judgment: Judgment normal.      Assessment & Plan:  1. Encounter to establish care   2. Chronic cluster headache, not intractable Acute chronic.  I query if sensation patient describes are cluster headaches or some other headache prodrome.  No red flags in history or on examination.  Discussed sleep hygiene at length and can consider medication to help with restful sleep like Trazodone.  Encouraged eye exam, may benefit from wearing glasses while working on the computer.  3. Immunization due  - Flu Vaccine QUAD 51mo+IM (Fluarix, Fluzone & Alfiuria Quad PF)  4. Left groin pain Acute.  No lymphadenopathy or masses appreciated on examination today.  Check urinalysis and screen for STD.  Possibly  musculoskeletal in etiology.  Follow up pending results.   - Urinalysis, Routine w reflex microscopic - Trichomonas vaginalis, RNA - C. trachomatis/N. gonorrhoeae RNA   Follow up plan: Return in about 2 weeks (around 03/07/2021) for CPE .

## 2021-02-21 ENCOUNTER — Ambulatory Visit: Payer: BC Managed Care – PPO | Admitting: Nurse Practitioner

## 2021-02-21 ENCOUNTER — Other Ambulatory Visit: Payer: Self-pay

## 2021-02-21 VITALS — BP 118/78 | HR 77 | Temp 97.3°F | Ht 71.0 in | Wt 177.0 lb

## 2021-02-21 DIAGNOSIS — R1032 Left lower quadrant pain: Secondary | ICD-10-CM | POA: Diagnosis not present

## 2021-02-21 DIAGNOSIS — G44029 Chronic cluster headache, not intractable: Secondary | ICD-10-CM | POA: Diagnosis not present

## 2021-02-21 DIAGNOSIS — Z7689 Persons encountering health services in other specified circumstances: Secondary | ICD-10-CM | POA: Diagnosis not present

## 2021-02-21 DIAGNOSIS — Z23 Encounter for immunization: Secondary | ICD-10-CM

## 2021-02-21 LAB — URINALYSIS, ROUTINE W REFLEX MICROSCOPIC
Bacteria, UA: NONE SEEN /HPF
Bilirubin Urine: NEGATIVE
Glucose, UA: NEGATIVE
Hgb urine dipstick: NEGATIVE
Hyaline Cast: NONE SEEN /LPF
Ketones, ur: NEGATIVE
Nitrite: NEGATIVE
Protein, ur: NEGATIVE
RBC / HPF: NONE SEEN /HPF (ref 0–2)
Specific Gravity, Urine: 1.01 (ref 1.001–1.035)
pH: 7 (ref 5.0–8.0)

## 2021-02-21 LAB — MICROSCOPIC MESSAGE

## 2021-02-21 NOTE — Patient Instructions (Signed)
Trazodone Tablets What is this medication? TRAZODONE (TRAZ oh done) treats depression. It increases the amount of serotonin in the brain, a hormone that helps regulate mood. This medicine may be used for other purposes; ask your health care provider or pharmacist if you have questions. COMMON BRAND NAME(S): Desyrel What should I tell my care team before I take this medication? They need to know if you have any of these conditions: Attempted suicide or thinking about it Bipolar disorder Bleeding problems Glaucoma Heart disease, or previous heart attack Irregular heart beat Kidney or liver disease Low levels of sodium in the blood An unusual or allergic reaction to trazodone, other medications, foods, dyes or preservatives Pregnant or trying to get pregnant Breast-feeding How should I use this medication? Take this medication by mouth with a glass of water. Follow the directions on the prescription label. Take this medication shortly after a meal or a light snack. Take your medication at regular intervals. Do not take your medication more often than directed. Do not stop taking this medication suddenly except upon the advice of your care team. Stopping this medication too quickly may cause serious side effects or your condition may worsen. A special MedGuide will be given to you by the pharmacist with each prescription and refill. Be sure to read this information carefully each time. Talk to your care team regarding the use of this medication in children. Special care may be needed. Overdosage: If you think you have taken too much of this medicine contact a poison control center or emergency room at once. NOTE: This medicine is only for you. Do not share this medicine with others. What if I miss a dose? If you miss a dose, take it as soon as you can. If it is almost time for your next dose, take only that dose. Do not take double or extra doses. What may interact with this medication? Do not  take this medication with any of the following: Certain medications for fungal infections like fluconazole, itraconazole, ketoconazole, posaconazole, voriconazole Cisapride Dronedarone Linezolid MAOIs like Carbex, Eldepryl, Marplan, Nardil, and Parnate Mesoridazine Methylene blue (injected into a vein) Pimozide Saquinavir Thioridazine This medication may also interact with the following: Alcohol Antiviral medications for HIV or AIDS Aspirin and aspirin-like medications Barbiturates like phenobarbital Certain medications for blood pressure, heart disease, irregular heart beat Certain medications for depression, anxiety, or psychotic disturbances Certain medications for migraine headache like almotriptan, eletriptan, frovatriptan, naratriptan, rizatriptan, sumatriptan, zolmitriptan Certain medications for seizures like carbamazepine and phenytoin Certain medications for sleep Certain medications that treat or prevent blood clots like dalteparin, enoxaparin, warfarin Digoxin Fentanyl Lithium NSAIDS, medications for pain and inflammation, like ibuprofen or naproxen Other medications that prolong the QT interval (cause an abnormal heart rhythm) like dofetilide Rasagiline Supplements like St. Behr's wort, kava kava, valerian Tramadol Tryptophan This list may not describe all possible interactions. Give your health care provider a list of all the medicines, herbs, non-prescription drugs, or dietary supplements you use. Also tell them if you smoke, drink alcohol, or use illegal drugs. Some items may interact with your medicine. What should I watch for while using this medication? Tell your care team if your symptoms do not get better or if they get worse. Visit your care team for regular checks on your progress. Because it may take several weeks to see the full effects of this medication, it is important to continue your treatment as prescribed by your care team. Watch for new or worsening  thoughts of   suicide or depression. This includes sudden changes in mood, behaviors, or thoughts. These changes can happen at any time but are more common in the beginning of treatment or after a change in dose. Call your care team right away if you experience these thoughts or worsening depression. Manic episodes may happen in patients with bipolar disorder who take this medication. Watch for changes in feelings or behaviors such as feeling anxious, nervous, agitated, panicky, irritable, hostile, aggressive, impulsive, severely restless, overly excited and hyperactive, or trouble sleeping. These changes can happen at any time but are more common in the beginning of treatment or after a change in dose. Call your care team right away if you notice any of these symptoms. You may get drowsy or dizzy. Do not drive, use machinery, or do anything that needs mental alertness until you know how this medication affects you. Do not stand or sit up quickly, especially if you are an older patient. This reduces the risk of dizzy or fainting spells. Alcohol may interfere with the effect of this medication. Avoid alcoholic drinks. This medication may cause dry eyes and blurred vision. If you wear contact lenses you may feel some discomfort. Lubricating drops may help. See your eye doctor if the problem does not go away or is severe. Your mouth may get dry. Chewing sugarless gum, sucking hard candy and drinking plenty of water may help. Contact your care team if the problem does not go away or is severe. What side effects may I notice from receiving this medication? Side effects that you should report to your care team as soon as possible: Allergic reactions-skin rash, itching, hives, swelling of the face, lips, tongue, or throat Bleeding-bloody or black, tar-like stools, red or dark brown urine, vomiting blood or brown material that looks like coffee grounds, small, red or purple spots on skin, unusual bleeding or  bruising Heart rhythm changes-fast or irregular heartbeat, dizziness, feeling faint or lightheaded, chest pain, trouble breathing Low blood pressure-dizziness, feeling faint or lightheaded, blurry vision Low sodium level-muscle weakness, fatigue, dizziness, headache, confusion Prolonged or painful erection Serotonin syndrome-irritability, confusion, fast or irregular heartbeat, muscle stiffness, twitching muscles, sweating, high fever, seizures, chills, vomiting, diarrhea Sudden eye pain or change in vision such as blurry vision, seeing halos around lights, vision loss Thoughts of suicide or self-harm, worsening mood, feelings of depression Side effects that usually do not require medical attention (report to your care team if they continue or are bothersome): Change in sex drive or performance Constipation Dizziness Drowsiness Dry mouth This list may not describe all possible side effects. Call your doctor for medical advice about side effects. You may report side effects to FDA at 1-800-FDA-1088. Where should I keep my medication? Keep out of the reach of children and pets. Store at room temperature between 15 and 30 degrees C (59 to 86 degrees F). Protect from light. Keep container tightly closed. Throw away any unused medication after the expiration date. NOTE: This sheet is a summary. It may not cover all possible information. If you have questions about this medicine, talk to your doctor, pharmacist, or health care provider.  2022 Elsevier/Gold Standard (2020-03-22 14:46:11)  

## 2021-02-22 LAB — C. TRACHOMATIS/N. GONORRHOEAE RNA
C. trachomatis RNA, TMA: NOT DETECTED
N. gonorrhoeae RNA, TMA: NOT DETECTED

## 2021-02-22 LAB — TRICHOMONAS VAGINALIS RNA, QL,MALES: Trichomonas vaginalis RNA: NOT DETECTED

## 2021-02-28 ENCOUNTER — Other Ambulatory Visit: Payer: Self-pay | Admitting: Nurse Practitioner

## 2021-02-28 ENCOUNTER — Encounter: Payer: Self-pay | Admitting: Nurse Practitioner

## 2021-02-28 MED ORDER — CENTRUM SILVER 50+MEN PO TABS: 1.0000 | ORAL_TABLET | ORAL | Status: AC

## 2021-02-28 MED ORDER — ZINC CHELATED 22.5 MG PO TABS: 1.0000 | ORAL_TABLET | ORAL | Status: AC

## 2021-02-28 MED ORDER — OMEGA-3 300 MG PO CAPS: 2.0000 | ORAL_CAPSULE | Freq: Every day | ORAL | Status: AC

## 2021-02-28 MED ORDER — VITAMIN D3 250 MCG (10000 UT) PO CAPS: 10000.0000 [IU] | ORAL_CAPSULE | ORAL | Status: AC

## 2021-02-28 NOTE — Addendum Note (Signed)
Addended by: Cathlean Marseilles A on: 02/28/2021 08:07 AM   Modules accepted: Orders

## 2021-03-09 ENCOUNTER — Telehealth: Payer: Self-pay

## 2021-03-09 ENCOUNTER — Encounter: Payer: BC Managed Care – PPO | Admitting: Nurse Practitioner

## 2021-03-09 NOTE — Progress Notes (Deleted)
There were no vitals taken for this visit.   Subjective:    Patient ID: Peter Chapman, male    DOB: [birth year], 55 y.o.   MRN: [id number]  HPI: Peter Chapman is a 55 y.o. male presenting on 03/09/2021 for comprehensive medical examination. Current medical complaints include:{Blank single:19197::"none","***"}  Interim Problems from his last visit: {Blank single:19197::"yes","no"}  Depression Screen done today and results listed below:  Depression screen [facility] 2/9 02/21/2021  Decreased Interest 0  Down, Depressed, Hopeless 0  PHQ - 2 Score 0    The patient {has/does not have:19849} a history of falls. I {did/did not:19850} complete a risk assessment for falls. A plan of care for falls {was/was not:19852} documented.   Past Medical History:  Past Medical History:  Diagnosis Date   Acid reflux     Surgical History:  No past surgical history on file.  Medications:  Current Outpatient Medications on File Prior to Visit  Medication Sig   Cholecalciferol (VITAMIN D3) 250 MCG (10000 UT) capsule Take 1 capsule (10,000 Units total) by mouth every other day.   Multiple Vitamins-Minerals (CENTRUM SILVER 50+MEN) TABS Take 1 tablet by mouth every other day.   Omega-3 300 MG CAPS Take 2 tablets by mouth daily.   Zinc Chelated 22.5 MG TABS Take 1 tablet by mouth every other day.   No current facility-administered medications on file prior to visit.    Allergies:  No Known Allergies  Social History:  Social History   Socioeconomic History   Marital status: Married    Spouse name: Not on file   Number of children: Not on file   Years of education: Not on file   Highest education level: Not on file  Occupational History   Not on file  Tobacco Use   Smoking status: Never   Smokeless tobacco: Never  Substance and Sexual Activity   Alcohol use: Yes   Drug use: No   Sexual activity: Not on file  Other Topics Concern   Not on file  Social History Narrative   Not on file   Social  Determinants of Health   Financial Resource Strain: Not on file  Food Insecurity: Not on file  Transportation Needs: Not on file  Physical Activity: Not on file  Stress: Not on file  Social Connections: Not on file  Intimate Partner Violence: Not on file   Social History   Tobacco Use  Smoking Status Never  Smokeless Tobacco Never   Social History   Substance and Sexual Activity  Alcohol Use Yes    Family History:  No family history on file.  Past medical history, surgical history, medications, allergies, family history and social history reviewed with patient today and changes made to appropriate areas of the chart.   ROS     Objective:    There were no vitals taken for this visit.  Wt Readings from Last 3 Encounters:  02/21/21 177 lb (80.3 kg)  02/05/15 164 lb (74.4 kg)    Physical Exam  Results for orders placed or performed in visit on 02/21/21  C. trachomatis/N. gonorrhoeae RNA   Specimen: Urine  Result Value Ref Range   C. trachomatis RNA, TMA NOT DETECTED NOT DETECTED   N. gonorrhoeae RNA, TMA NOT DETECTED NOT DETECTED  Urinalysis, Routine w reflex microscopic  Result Value Ref Range   Color, Urine YELLOW YELLOW   APPearance CLEAR CLEAR   Specific Gravity, Urine 1.010 1.001 - 1.035   pH 7.0 5.0 - 8.0  Glucose, UA NEGATIVE NEGATIVE   Bilirubin Urine NEGATIVE NEGATIVE   Ketones, ur NEGATIVE NEGATIVE   Hgb urine dipstick NEGATIVE NEGATIVE   Protein, ur NEGATIVE NEGATIVE   Nitrite NEGATIVE NEGATIVE   Leukocytes,Ua 1+ (A) NEGATIVE   WBC, UA 0-5 0 - 5 /HPF   RBC / HPF NONE SEEN 0 - 2 /HPF   Squamous Epithelial / LPF 0-5 < OR = 5 /HPF   Bacteria, UA NONE SEEN NONE SEEN /HPF   Hyaline Cast NONE SEEN NONE SEEN /LPF  Microscopic Message  Result Value Ref Range   Note    Trichomonas vaginalis RNA, Ql,Males  Result Value Ref Range   Trichomonas vaginalis RNA NOT DETECTED NOT DETECTED      Assessment & Plan:   Problem List Items Addressed This Visit    None    Discussed aspirin prophylaxis for myocardial infarction prevention and decision was {Blank single:19197::"it was not indicated","made to continue ASA","made to start ASA","made to stop ASA","that we recommended ASA, and patient refused"}  LABORATORY TESTING:  Health maintenance labs ordered today as discussed above.   The natural history of prostate cancer and ongoing controversy regarding screening and potential treatment outcomes of prostate cancer has been discussed with the patient. The meaning of a false positive PSA and a false negative PSA has been discussed. He indicates understanding of the limitations of this screening test and wishes *** to proceed with screening PSA testing.   IMMUNIZATIONS:   - Tdap: Tetanus vaccination status reviewed: {tetanus status:315746}. - Influenza: {Blank single:19197::"Up to date","Administered today","Postponed to flu season","Refused","Given elsewhere"} - Pneumovax: {Blank single:19197::"Up to date","Administered today","Not applicable","Refused","Given elsewhere"} - Prevnar: {Blank single:19197::"Up to date","Administered today","Not applicable","Refused","Given elsewhere"} - HPV: {Blank single:19197::"Up to date","Administered today","Not applicable","Refused","Given elsewhere"} - Zostavax vaccine: {Blank single:19197::"Up to date","Administered today","Not applicable","Refused","Given elsewhere"} - COVID-19 vaccine:  SCREENING: - Colonoscopy: {Blank single:19197::"Up to date","Ordered today","Not applicable","Refused","Done elsewhere"}  Discussed with patient purpose of the colonoscopy is to detect colon cancer at curable precancerous or early stages   - AAA Screening: {Blank single:19197::"Up to date","Ordered today","Not applicable","Refused","Done elsewhere"}  -Hearing Test: {Blank single:19197::"Up to date","Ordered today","Not applicable","Refused","Done elsewhere"}  -Spirometry: {Blank single:19197::"Up to date","Ordered  today","Not applicable","Refused","Done elsewhere"}   PATIENT COUNSELING:    Sexuality: Discussed sexually transmitted diseases, partner selection, use of condoms, avoidance of unintended pregnancy  and contraceptive alternatives.   Advised to avoid cigarette smoking.  I discussed with the patient that most people either abstain from alcohol or drink within safe limits (<=14/week and <=4 drinks/occasion for males, <=7/weeks and <= 3 drinks/occasion for females) and that the risk for alcohol disorders and other health effects rises proportionally with the number of drinks per week and how often a drinker exceeds daily limits.  Discussed cessation/primary prevention of drug use and availability of treatment for abuse.   Diet: Encouraged to adjust caloric intake to maintain  or achieve ideal body weight, to reduce intake of dietary saturated fat and total fat, to limit sodium intake by avoiding high sodium foods and not adding table salt, and to maintain adequate dietary potassium and calcium preferably from fresh fruits, vegetables, and low-fat dairy products.    stressed the importance of regular exercise  Injury prevention: Discussed safety belts, safety helmets, smoke detector, smoking near bedding or upholstery.   Dental health: Discussed importance of regular tooth brushing, flossing, and dental visits.   Follow up plan: NEXT PREVENTATIVE PHYSICAL DUE IN 1 YEAR. No follow-ups on file.

## 2021-03-16 ENCOUNTER — Encounter: Payer: BC Managed Care – PPO | Admitting: Nurse Practitioner

## 2021-03-23 ENCOUNTER — Encounter: Payer: BC Managed Care – PPO | Admitting: Nurse Practitioner

## 2021-03-23 NOTE — Progress Notes (Deleted)
There were no vitals taken for this visit.   Subjective:    Patient ID: Peter Chapman, male    DOB: 19-Jun-1965, 55 y.o.   MRN: [id number]  HPI: Peter Chapman is a 55 y.o. male presenting on 03/23/2021 for comprehensive medical examination. Current medical complaints include:{Blank single:19197::"none","***"}  Interim Problems from his last visit: {Blank single:19197::"yes","no"}  Depression Screen done today and results listed below:  Depression screen [facility] 2/9 02/21/2021  Decreased Interest 0  Down, Depressed, Hopeless 0  PHQ - 2 Score 0    The patient {has/does not have:19849} a history of falls. I {did/did not:19850} complete a risk assessment for falls. A plan of care for falls {was/was not:19852} documented.   Past Medical History:  Past Medical History:  Diagnosis Date   Acid reflux     Surgical History:  No past surgical history on file.  Medications:  Current Outpatient Medications on File Prior to Visit  Medication Sig   Cholecalciferol (VITAMIN D3) 250 MCG (10000 UT) capsule Take 1 capsule (10,000 Units total) by mouth every other day.   Multiple Vitamins-Minerals (CENTRUM SILVER 50+MEN) TABS Take 1 tablet by mouth every other day.   Omega-3 300 MG CAPS Take 2 tablets by mouth daily.   Zinc Chelated 22.5 MG TABS Take 1 tablet by mouth every other day.   No current facility-administered medications on file prior to visit.    Allergies:  No Known Allergies  Social History:  Social History   Socioeconomic History   Marital status: Married    Spouse name: Not on file   Number of children: Not on file   Years of education: Not on file   Highest education level: Not on file  Occupational History   Not on file  Tobacco Use   Smoking status: Never   Smokeless tobacco: Never  Substance and Sexual Activity   Alcohol use: Yes   Drug use: No   Sexual activity: Not on file  Other Topics Concern   Not on file  Social History Narrative   Not on file   Social  Determinants of Health   Financial Resource Strain: Not on file  Food Insecurity: Not on file  Transportation Needs: Not on file  Physical Activity: Not on file  Stress: Not on file  Social Connections: Not on file  Intimate Partner Violence: Not on file   Social History   Tobacco Use  Smoking Status Never  Smokeless Tobacco Never   Social History   Substance and Sexual Activity  Alcohol Use Yes    Family History:  No family history on file.  Past medical history, surgical history, medications, allergies, family history and social history reviewed with patient today and changes made to appropriate areas of the chart.   ROS     Objective:    There were no vitals taken for this visit.  Wt Readings from Last 3 Encounters:  02/21/21 177 lb (80.3 kg)  02/05/15 164 lb (74.4 kg)    Physical Exam  Results for orders placed or performed in visit on 02/21/21  C. trachomatis/N. gonorrhoeae RNA   Specimen: Urine  Result Value Ref Range   C. trachomatis RNA, TMA NOT DETECTED NOT DETECTED   N. gonorrhoeae RNA, TMA NOT DETECTED NOT DETECTED  Urinalysis, Routine w reflex microscopic  Result Value Ref Range   Color, Urine YELLOW YELLOW   APPearance CLEAR CLEAR   Specific Gravity, Urine 1.010 1.001 - 1.035   pH 7.0 5.0 - 8.0  Glucose, UA NEGATIVE NEGATIVE   Bilirubin Urine NEGATIVE NEGATIVE   Ketones, ur NEGATIVE NEGATIVE   Hgb urine dipstick NEGATIVE NEGATIVE   Protein, ur NEGATIVE NEGATIVE   Nitrite NEGATIVE NEGATIVE   Leukocytes,Ua 1+ (A) NEGATIVE   WBC, UA 0-5 0 - 5 /HPF   RBC / HPF NONE SEEN 0 - 2 /HPF   Squamous Epithelial / LPF 0-5 < OR = 5 /HPF   Bacteria, UA NONE SEEN NONE SEEN /HPF   Hyaline Cast NONE SEEN NONE SEEN /LPF  Microscopic Message  Result Value Ref Range   Note    Trichomonas vaginalis RNA, Ql,Males  Result Value Ref Range   Trichomonas vaginalis RNA NOT DETECTED NOT DETECTED      Assessment & Plan:   Problem List Items Addressed This Visit    None    Discussed aspirin prophylaxis for myocardial infarction prevention and decision was {Blank single:19197::"it was not indicated","made to continue ASA","made to start ASA","made to stop ASA","that we recommended ASA, and patient refused"}  LABORATORY TESTING:  Health maintenance labs ordered today as discussed above.   The natural history of prostate cancer and ongoing controversy regarding screening and potential treatment outcomes of prostate cancer has been discussed with the patient. The meaning of a false positive PSA and a false negative PSA has been discussed. He indicates understanding of the limitations of this screening test and wishes *** to proceed with screening PSA testing.   IMMUNIZATIONS:   - Tdap: Tetanus vaccination status reviewed: {tetanus status:315746}. - Influenza: {Blank single:19197::"Up to date","Administered today","Postponed to flu season","Refused","Given elsewhere"} - Pneumovax: {Blank single:19197::"Up to date","Administered today","Not applicable","Refused","Given elsewhere"} - Prevnar: {Blank single:19197::"Up to date","Administered today","Not applicable","Refused","Given elsewhere"} - HPV: {Blank single:19197::"Up to date","Administered today","Not applicable","Refused","Given elsewhere"} - Zostavax vaccine: {Blank single:19197::"Up to date","Administered today","Not applicable","Refused","Given elsewhere"} - COVID-19 vaccine:  SCREENING: - Colonoscopy: {Blank single:19197::"Up to date","Ordered today","Not applicable","Refused","Done elsewhere"}  Discussed with patient purpose of the colonoscopy is to detect colon cancer at curable precancerous or early stages   - AAA Screening: {Blank single:19197::"Up to date","Ordered today","Not applicable","Refused","Done elsewhere"}  -Hearing Test: {Blank single:19197::"Up to date","Ordered today","Not applicable","Refused","Done elsewhere"}  -Spirometry: {Blank single:19197::"Up to date","Ordered  today","Not applicable","Refused","Done elsewhere"}   PATIENT COUNSELING:    Sexuality: Discussed sexually transmitted diseases, partner selection, use of condoms, avoidance of unintended pregnancy  and contraceptive alternatives.   Advised to avoid cigarette smoking.  I discussed with the patient that most people either abstain from alcohol or drink within safe limits (<=14/week and <=4 drinks/occasion for males, <=7/weeks and <= 3 drinks/occasion for females) and that the risk for alcohol disorders and other health effects rises proportionally with the number of drinks per week and how often a drinker exceeds daily limits.  Discussed cessation/primary prevention of drug use and availability of treatment for abuse.   Diet: Encouraged to adjust caloric intake to maintain  or achieve ideal body weight, to reduce intake of dietary saturated fat and total fat, to limit sodium intake by avoiding high sodium foods and not adding table salt, and to maintain adequate dietary potassium and calcium preferably from fresh fruits, vegetables, and low-fat dairy products.    stressed the importance of regular exercise  Injury prevention: Discussed safety belts, safety helmets, smoke detector, smoking near bedding or upholstery.   Dental health: Discussed importance of regular tooth brushing, flossing, and dental visits.   Follow up plan: NEXT PREVENTATIVE PHYSICAL DUE IN 1 YEAR. No follow-ups on file.

## 2021-04-04 ENCOUNTER — Ambulatory Visit: Payer: BC Managed Care – PPO | Admitting: Neurology

## 2021-04-11 ENCOUNTER — Encounter: Payer: BC Managed Care – PPO | Admitting: Nurse Practitioner

## 2021-04-14 ENCOUNTER — Ambulatory Visit: Payer: BC Managed Care – PPO | Admitting: Nurse Practitioner

## 2021-04-15 ENCOUNTER — Telehealth: Payer: Self-pay

## 2021-04-15 NOTE — Telephone Encounter (Signed)
Pt called in with questions about bill. Pt stated that his insurance [employer]  denied a claim for one of his ov at this office. Pt just wanted to know why this claim was denied. This is some of the info obtained from pt:  DOS: 02/21/2021 INSURANCE: [employer] AMOUNT: $395.00 BEST CONTACT FOR PT: [phone]

## 2021-04-18 ENCOUNTER — Ambulatory Visit: Payer: BC Managed Care – PPO | Admitting: Nurse Practitioner

## 2021-04-29 NOTE — Telephone Encounter (Signed)
I have reviewed patients chart and I have resubmitted the claim to [facility] it looks like the primary dx code was incorrect for them to pay. I have left vm asking pt to return my call and advised him that the front could relay message.

## 2021-05-19 ENCOUNTER — Ambulatory Visit: Payer: BC Managed Care – PPO | Admitting: Neurology

## 2021-07-26 NOTE — Telephone Encounter (Signed)
Erroneous encounter. Please disregard.

## 2021-09-28 DIAGNOSIS — R519 Headache, unspecified: Secondary | ICD-10-CM | POA: Diagnosis not present

## 2021-09-28 DIAGNOSIS — Z79899 Other long term (current) drug therapy: Secondary | ICD-10-CM | POA: Diagnosis not present

## 2021-09-28 DIAGNOSIS — M129 Arthropathy, unspecified: Secondary | ICD-10-CM | POA: Diagnosis not present

## 2021-09-28 DIAGNOSIS — G43909 Migraine, unspecified, not intractable, without status migrainosus: Secondary | ICD-10-CM | POA: Diagnosis not present

## 2021-10-06 ENCOUNTER — Encounter (HOSPITAL_COMMUNITY): Payer: Self-pay

## 2021-10-06 ENCOUNTER — Emergency Department (HOSPITAL_COMMUNITY)
Admission: EM | Admit: 2021-10-06 | Discharge: 2021-10-06 | Disposition: A | Discharge status: Home / Self Care | Payer: BC Managed Care – PPO | Attending: Emergency Medicine | Admitting: Emergency Medicine

## 2021-10-06 ENCOUNTER — Emergency Department (HOSPITAL_COMMUNITY): Payer: BC Managed Care – PPO

## 2021-10-06 DIAGNOSIS — R200 Anesthesia of skin: Secondary | ICD-10-CM | POA: Insufficient documentation

## 2021-10-06 DIAGNOSIS — R519 Headache, unspecified: Secondary | ICD-10-CM | POA: Diagnosis not present

## 2021-10-06 NOTE — ED Triage Notes (Signed)
Patient states that he began having "head fog" and (points to his forehead) 3 years ago. Patient states he is unable to focus, gets angry, and numbness began afterwards. Patient states in the last 6 months the symptoms became more frequent. Patient states his symptoms are now every other day. Patient states he felt dizzy last night which is a new symptom.  Patient states when he has the "head fog" his vision is more distorted and has increased sensitivity to light and sound. Patient denies any nausea.

## 2021-10-06 NOTE — ED Provider Notes (Signed)
[facility]-EMERGENCY DEPT Provider Note   CSN: [id number] Arrival date & time: 10/06/21  1600     History  No chief complaint on file.   Peter Chapman is a 56 y.o. male.  56 year old male presents with head fog x3 years.  Patient states that he has had trouble focusing, some emotional lability.  Denies taking any medications for this.  Denies any urinary weakness.  No vision loss.  Some associated headache.  Has had frontal tingling with some subjective rash.  Has not seen a provider for this      Home Medications Prior to Admission medications   Medication Sig Start Date End Date Taking? Authorizing Provider  Cholecalciferol (VITAMIN D3) 250 MCG (10000 UT) capsule Take 1 capsule (10,000 Units total) by mouth every other day. 02/28/21   Valentino Nose, NP  Multiple Vitamins-Minerals (CENTRUM SILVER 50+MEN) TABS Take 1 tablet by mouth every other day. 02/28/21   Valentino Nose, NP  Omega-3 300 MG CAPS Take 2 tablets by mouth daily. 02/28/21   Valentino Nose, NP  Zinc Chelated 22.5 MG TABS Take 1 tablet by mouth every other day. 02/28/21   Valentino Nose, NP      Allergies    Patient has no known allergies.    Review of Systems   Review of Systems  All other systems reviewed and are negative.  Physical Exam Updated Vital Signs BP (!) 159/94 (BP Location: Left Arm)   Pulse 93   Temp 98.6 F (37 C) (Oral)   Resp 18   Ht 1.829 m (6')   Wt 78.9 kg   SpO2 99%   BMI 23.60 kg/m  Physical Exam Vitals and nursing note reviewed.  Constitutional:      General: He is not in acute distress.    Appearance: Normal appearance. He is well-developed. He is not toxic-appearing.  HENT:     Head: Normocephalic and atraumatic.  Eyes:     General: Lids are normal.     Conjunctiva/sclera: Conjunctivae normal.     Pupils: Pupils are equal, round, and reactive to light.  Neck:     Thyroid: No thyroid mass.     Trachea: No tracheal deviation.   Cardiovascular:     Rate and Rhythm: Normal rate and regular rhythm.     Heart sounds: Normal heart sounds. No murmur heard.   No gallop.  Pulmonary:     Effort: Pulmonary effort is normal. No respiratory distress.     Breath sounds: Normal breath sounds. No stridor. No decreased breath sounds, wheezing, rhonchi or rales.  Abdominal:     General: There is no distension.     Palpations: Abdomen is soft.     Tenderness: There is no abdominal tenderness. There is no rebound.  Musculoskeletal:        General: No tenderness. Normal range of motion.     Cervical back: Normal range of motion and neck supple.  Skin:    General: Skin is warm and dry.     Findings: No abrasion or rash.  Neurological:     Mental Status: He is alert and oriented to person, place, and time. Mental status is at baseline.     GCS: GCS eye subscore is 4. GCS verbal subscore is 5. GCS motor subscore is 6.     Cranial Nerves: No cranial nerve deficit.     Sensory: No sensory deficit.     Motor: Motor function is intact.  Psychiatric:  Attention and Perception: Attention normal.        Speech: Speech normal.        Behavior: Behavior normal.    ED Results / Procedures / Treatments   Labs (all labs ordered are listed, but only abnormal results are displayed) Labs Reviewed - No data to display  EKG None  Radiology No results found.  Procedures Procedures    Medications Ordered in ED Medications - No data to display  ED Course/ Medical Decision Making/ A&P                           Medical Decision Making Amount and/or Complexity of Data Reviewed Radiology: ordered.   Patient presented with 3 years mild frontal headache with periodic numbness.  Considerations included stroke versus intracranial tumor.  Head CT performed and per my interpretation showed no acute findings.  He has no focal neurological deficits on exam.  Do not feel that he needs any further imaging at this time or work-up.   Patient has outpatient MRI scheduled for next month and oriented that he should keep it.  He will be given a referral to neurology.  Discussed with significant other who was in the room.  Return precautions given        Final Clinical Impression(s) / ED Diagnoses Final diagnoses:  None    Rx / DC Orders ED Discharge Orders     None         Lorre Nick, MD 10/06/21 1842

## 2021-10-06 NOTE — ED Provider Triage Note (Signed)
Emergency Medicine Provider Triage Evaluation Note  Peter Chapman , a 56 y.o. male  was evaluated in triage.  Pt complains of "brain shroud." Patient had onset of a sensation of a "brain shroud" starting  3 years ago. Feels a sensation of something covering his forehead. He has also had scalp numbness and tingling. Mild headache. Now having very frequent "brain shroud" which he describes as " debilitating. Assoc photophobia. Constant numbness and int tingling of scalp. Has an Op MRI scheduled on 6/16- states that he is desperate and came today for an MRI   Review of Systems  Positive: Brain shroud Negative: Vision change  Physical Exam  BP (!) 159/94 (BP Location: Left Arm)   Pulse 93   Temp 98.6 F (37 C) (Oral)   Resp 18   Ht 6' (1.829 m)   Wt 78.9 kg   SpO2 99%   BMI 23.60 kg/m  Gen:   Awake, no distress   Resp:  Normal effort  MSK:   Moves extremities without difficulty  Other:  Unusual affect  Medical Decision Making  Medically screening exam initiated at 4:26 PM.  Appropriate orders placed.  Peter Chapman was informed that the remainder of the evaluation will be completed by another provider, this initial triage assessment does not replace that evaluation, and the importance of remaining in the ED until their evaluation is complete.    Arthor Captain, PA-C 10/06/21 [phone]

## 2021-10-06 NOTE — ED Notes (Signed)
An After Visit Summary was printed and given to the patient. Discharge instructions given and no further questions at this time.  Pt leaving with family.  

## 2021-10-06 NOTE — Discharge Instructions (Signed)
Keep your appointment for your MRI scheduled for next month.  Return here for any problems

## 2021-10-11 ENCOUNTER — Other Ambulatory Visit: Payer: Self-pay | Admitting: Nurse Practitioner

## 2021-10-11 DIAGNOSIS — R519 Headache, unspecified: Secondary | ICD-10-CM

## 2021-12-26 ENCOUNTER — Ambulatory Visit: Payer: BC Managed Care – PPO | Admitting: Neurology

## 2022-01-02 DIAGNOSIS — J069 Acute upper respiratory infection, unspecified: Secondary | ICD-10-CM | POA: Diagnosis not present

## 2022-01-11 DIAGNOSIS — H00021 Hordeolum internum right upper eyelid: Secondary | ICD-10-CM | POA: Diagnosis not present

## 2022-01-12 DIAGNOSIS — H00011 Hordeolum externum right upper eyelid: Secondary | ICD-10-CM | POA: Diagnosis not present

## 2022-01-12 DIAGNOSIS — J069 Acute upper respiratory infection, unspecified: Secondary | ICD-10-CM | POA: Diagnosis not present

## 2022-01-19 DIAGNOSIS — H00021 Hordeolum internum right upper eyelid: Secondary | ICD-10-CM | POA: Diagnosis not present

## 2022-01-19 DIAGNOSIS — H532 Diplopia: Secondary | ICD-10-CM | POA: Diagnosis not present

## 2022-01-19 DIAGNOSIS — H1045 Other chronic allergic conjunctivitis: Secondary | ICD-10-CM | POA: Diagnosis not present

## 2022-01-19 DIAGNOSIS — H2513 Age-related nuclear cataract, bilateral: Secondary | ICD-10-CM | POA: Diagnosis not present

## 2022-02-14 DIAGNOSIS — Z1211 Encounter for screening for malignant neoplasm of colon: Secondary | ICD-10-CM | POA: Diagnosis not present

## 2022-02-14 DIAGNOSIS — Z0000 Encounter for general adult medical examination without abnormal findings: Secondary | ICD-10-CM | POA: Diagnosis not present

## 2022-02-14 DIAGNOSIS — E785 Hyperlipidemia, unspecified: Secondary | ICD-10-CM | POA: Diagnosis not present

## 2022-02-14 DIAGNOSIS — Z125 Encounter for screening for malignant neoplasm of prostate: Secondary | ICD-10-CM | POA: Diagnosis not present

## 2022-02-20 DIAGNOSIS — I10 Essential (primary) hypertension: Secondary | ICD-10-CM | POA: Diagnosis not present

## 2022-02-20 DIAGNOSIS — J069 Acute upper respiratory infection, unspecified: Secondary | ICD-10-CM | POA: Diagnosis not present

## 2022-02-20 DIAGNOSIS — R6883 Chills (without fever): Secondary | ICD-10-CM | POA: Diagnosis not present

## 2022-02-20 DIAGNOSIS — R059 Cough, unspecified: Secondary | ICD-10-CM | POA: Diagnosis not present

## 2022-02-23 DIAGNOSIS — Z1211 Encounter for screening for malignant neoplasm of colon: Secondary | ICD-10-CM | POA: Diagnosis not present
